# Patient Record
Sex: Male | Born: 1990 | Race: White | Hispanic: No | Marital: Single | Smoking: Former smoker
Health system: Southern US, Community
[De-identification: ages and names within clinical notes are randomized; demographics above are authoritative.]

---

## 2012-02-21 ENCOUNTER — Emergency Department: Payer: Self-pay | Admitting: Emergency Medicine

## 2012-12-17 IMAGING — CR DG ANKLE COMPLETE 3+V*L*
1 series · 5 of 5 positions shown · non-contrast
Comparison: none

REASON FOR EXAM: pain.
COMMENTS:

PROCEDURE:     DXR - DXR ANKLE LEFT COMPLETE  - February 21, 2012  [DATE]
RESULT:     Mild soft tissue swelling otherwise no acute abnormality. No
evidence of fracture.

[Series 3: x ankle ap left · 0.14mm/px · 5 of 5 slices shown]
[im 1/5]
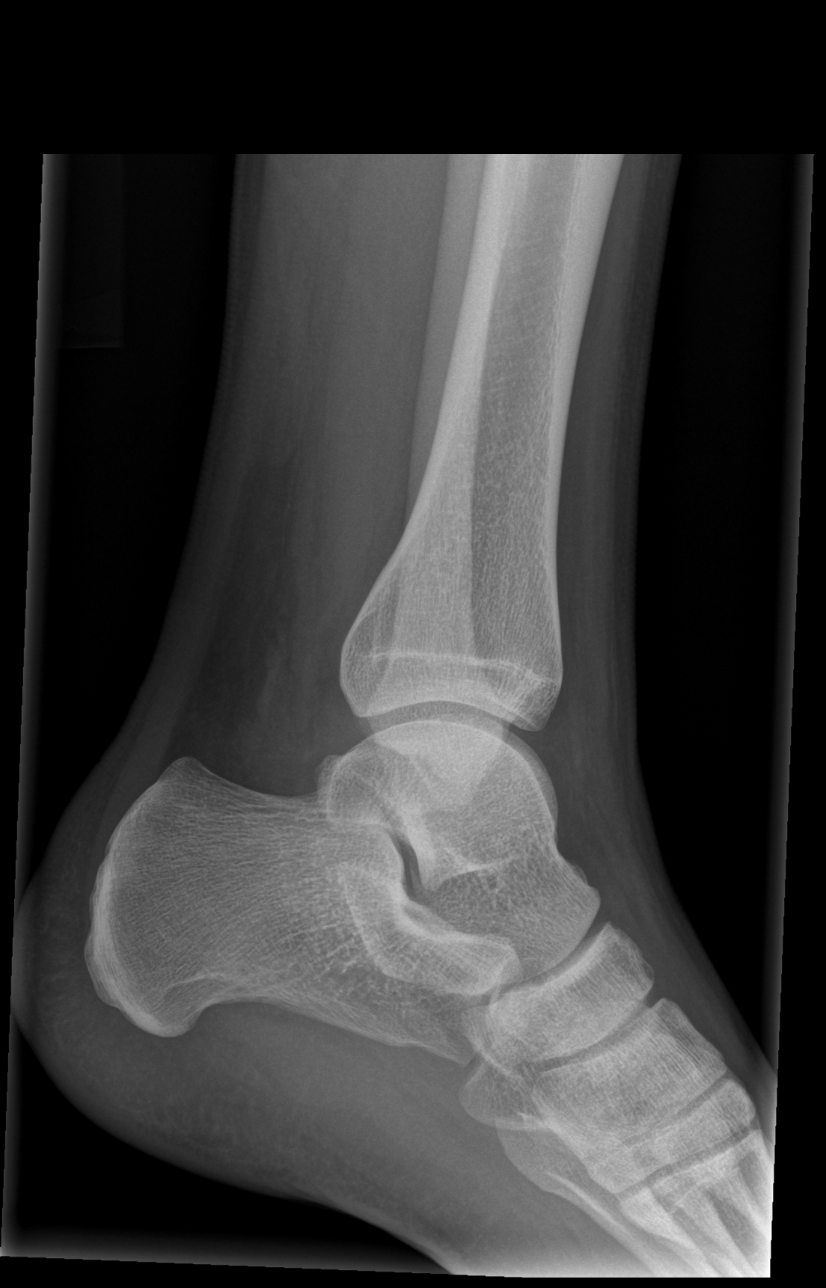
[im 2/5]
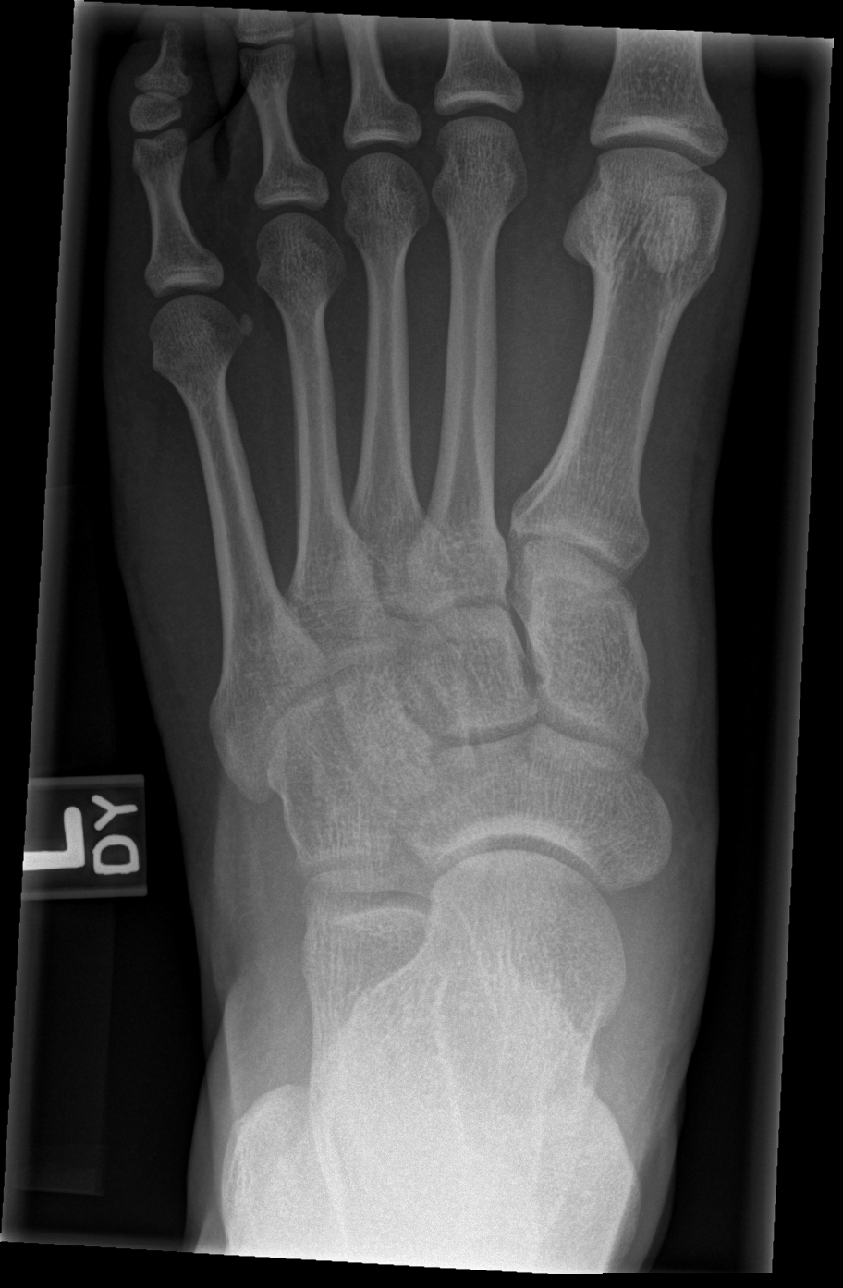
[im 3/5]
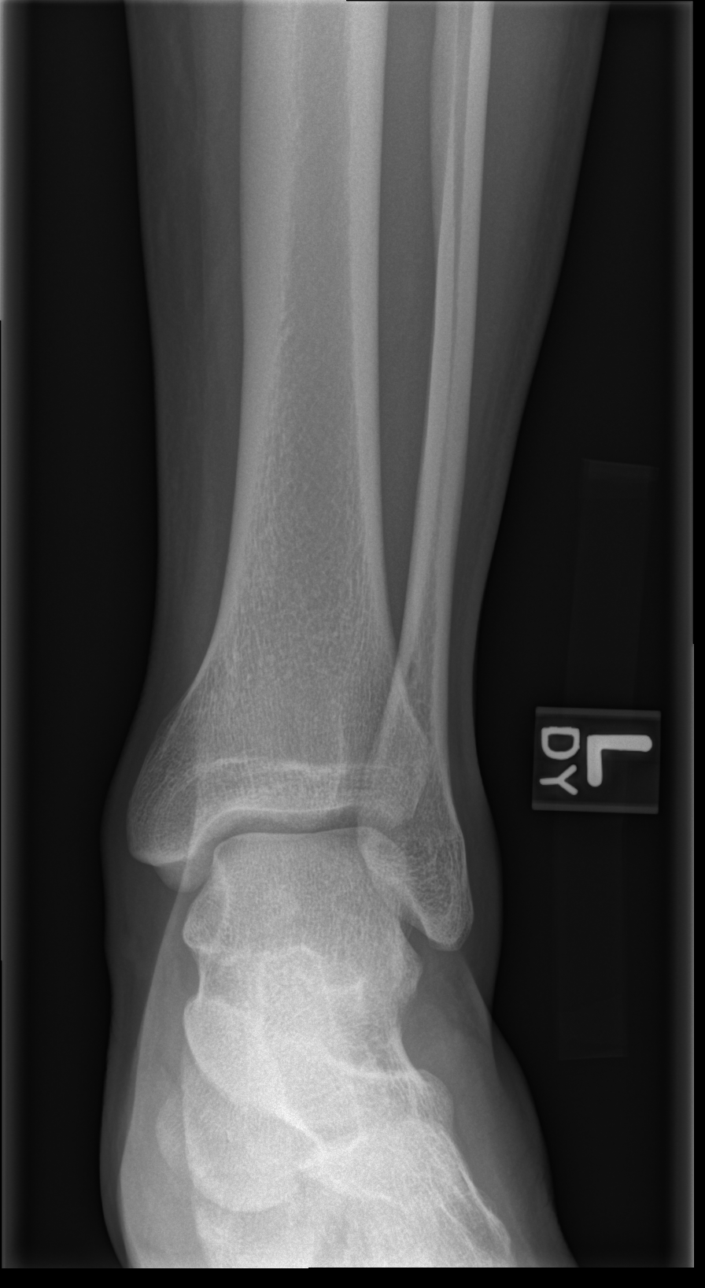
[im 4/5]
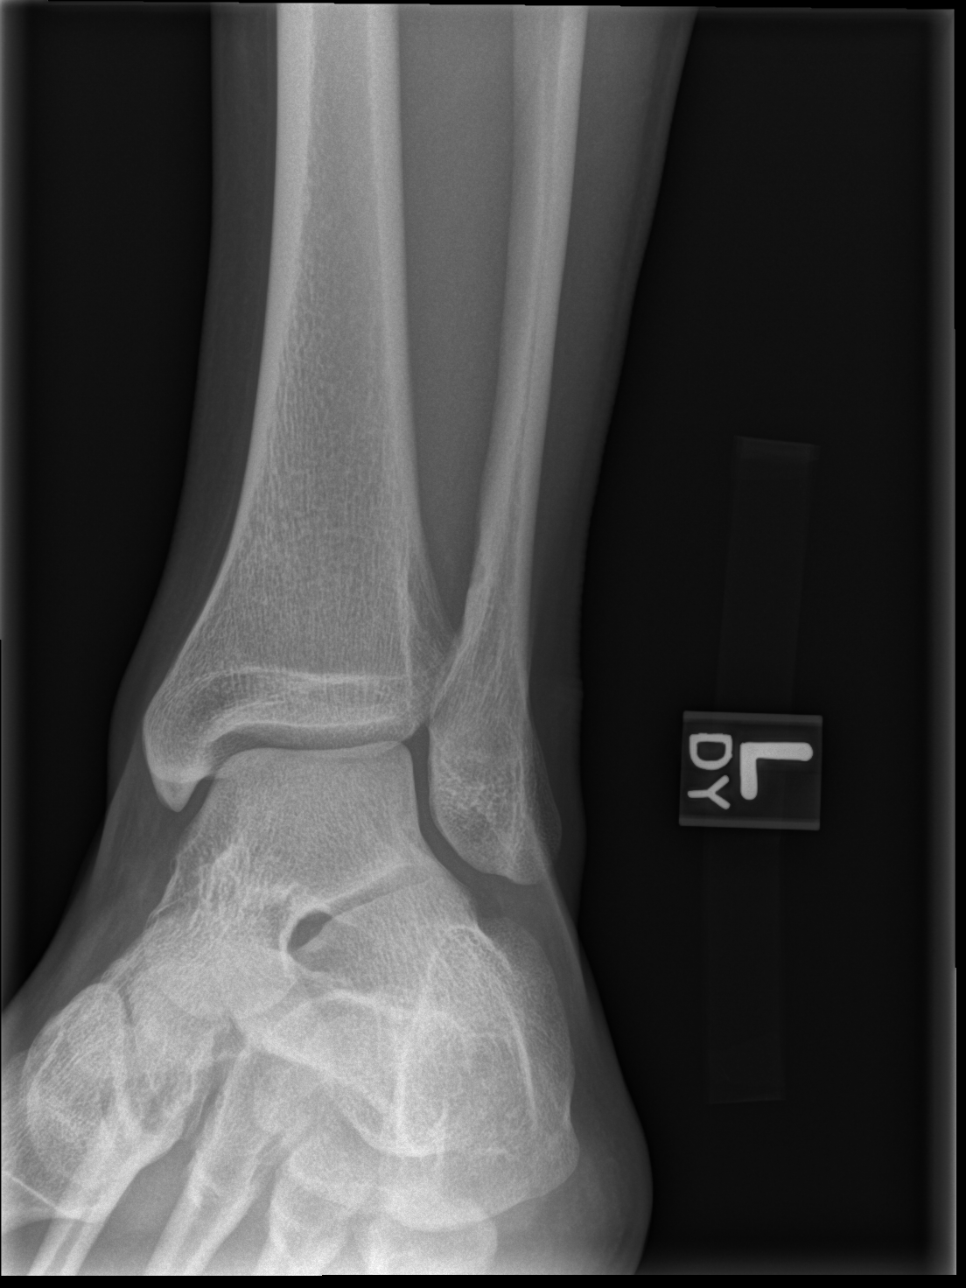
[im 5/5]
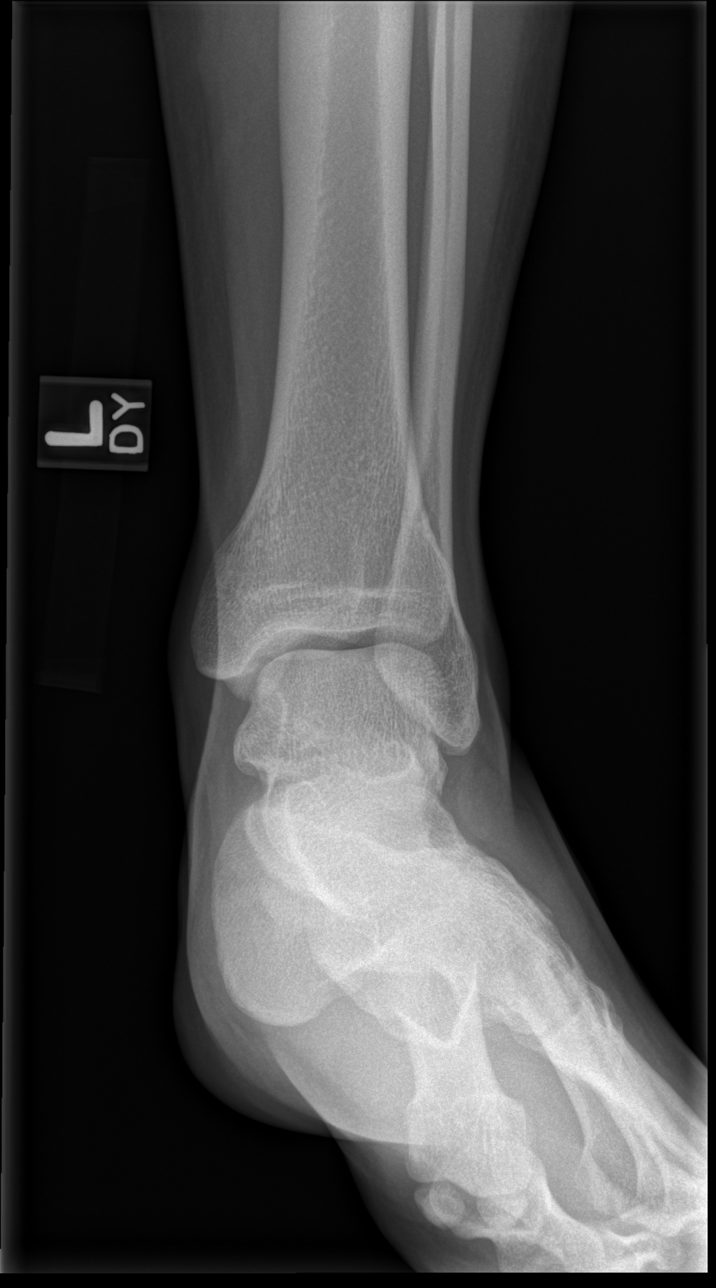

[5 of 5 positions shown; findings below may reference images not displayed]

IMPRESSION: No significant abnormality.

## 2014-05-17 ENCOUNTER — Emergency Department: Payer: Self-pay | Admitting: Emergency Medicine

## 2015-04-08 ENCOUNTER — Encounter: Payer: Self-pay | Admitting: Emergency Medicine

## 2015-04-08 ENCOUNTER — Emergency Department
Admission: EM | Admit: 2015-04-08 | Discharge: 2015-04-08 | Disposition: A | Discharge status: Home / Self Care | Payer: Worker's Compensation | Attending: Emergency Medicine | Admitting: Emergency Medicine

## 2015-04-08 DIAGNOSIS — Z79899 Other long term (current) drug therapy: Secondary | ICD-10-CM | POA: Diagnosis not present

## 2015-04-08 DIAGNOSIS — Y92410 Unspecified street and highway as the place of occurrence of the external cause: Secondary | ICD-10-CM | POA: Insufficient documentation

## 2015-04-08 DIAGNOSIS — Y998 Other external cause status: Secondary | ICD-10-CM | POA: Insufficient documentation

## 2015-04-08 DIAGNOSIS — S76911A Strain of unspecified muscles, fascia and tendons at thigh level, right thigh, initial encounter: Secondary | ICD-10-CM | POA: Diagnosis not present

## 2015-04-08 DIAGNOSIS — S39012A Strain of muscle, fascia and tendon of lower back, initial encounter: Secondary | ICD-10-CM | POA: Diagnosis not present

## 2015-04-08 DIAGNOSIS — Z87891 Personal history of nicotine dependence: Secondary | ICD-10-CM | POA: Diagnosis not present

## 2015-04-08 DIAGNOSIS — S3992XA Unspecified injury of lower back, initial encounter: Secondary | ICD-10-CM | POA: Diagnosis present

## 2015-04-08 DIAGNOSIS — Y9389 Activity, other specified: Secondary | ICD-10-CM | POA: Diagnosis not present

## 2015-04-08 MED ORDER — IBUPROFEN 800 MG PO TABS
ORAL_TABLET | ORAL | Status: AC
  Administered 2015-04-08: 800 mg via ORAL
  Filled 2015-04-08: qty 1

## 2015-04-08 MED ORDER — IBUPROFEN 800 MG PO TABS
800.0000 mg | ORAL_TABLET | Freq: Once | ORAL | Status: AC
  Administered 2015-04-08: 800 mg via ORAL
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
## 2016-04-15 ENCOUNTER — Encounter: Payer: Self-pay | Admitting: Physician Assistant

## 2016-04-15 ENCOUNTER — Ambulatory Visit: Payer: Self-pay | Admitting: Physician Assistant

## 2016-04-15 VITALS — BP 130/80 | HR 76 | Temp 98.5°F

## 2016-04-15 DIAGNOSIS — Z113 Encounter for screening for infections with a predominantly sexual mode of transmission: Secondary | ICD-10-CM
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
## 2016-04-15 NOTE — Addendum Note (Signed)
Addended by: Catha BrowEACON, MONIQUE T on: 04/15/2016 02:19 PM   Modules accepted: Orders

## 2016-04-16 LAB — HIV ANTIBODY (ROUTINE TESTING W REFLEX): HIV SCREEN 4TH GENERATION: NONREACTIVE

## 2016-04-16 LAB — RPR QUALITATIVE: RPR Ser Ql: NONREACTIVE
